# Patient Record
Sex: Female | Born: 1990 | Race: Black or African American | Hispanic: No | Marital: Single | Smoking: Never smoker
Health system: Southern US, Community
[De-identification: ages and names within clinical notes are randomized; demographics above are authoritative.]

## PROBLEM LIST (undated history)

## (undated) DIAGNOSIS — R569 Unspecified convulsions: Secondary | ICD-10-CM

---

## 2013-11-23 LAB — URINALYSIS, COMPLETE
BILIRUBIN, UR: NEGATIVE
Bacteria: NONE SEEN
GLUCOSE, UR: NEGATIVE mg/dL (ref 0–75)
Ketone: NEGATIVE
LEUKOCYTE ESTERASE: NEGATIVE
Nitrite: NEGATIVE
PH: 5 (ref 4.5–8.0)
Protein: NEGATIVE
RBC,UR: 11 /HPF (ref 0–5)
SPECIFIC GRAVITY: 1.018 (ref 1.003–1.030)
WBC UR: 3 /HPF (ref 0–5)

## 2013-11-23 LAB — COMPREHENSIVE METABOLIC PANEL
ALBUMIN: 3.8 g/dL (ref 3.4–5.0)
Alkaline Phosphatase: 80 U/L
Anion Gap: 7 (ref 7–16)
BUN: 11 mg/dL (ref 7–18)
Bilirubin,Total: 0.5 mg/dL (ref 0.2–1.0)
CO2: 25 mmol/L (ref 21–32)
Calcium, Total: 9.4 mg/dL (ref 8.5–10.1)
Chloride: 103 mmol/L (ref 98–107)
Creatinine: 0.7 mg/dL (ref 0.60–1.30)
EGFR (African American): >60
Glucose: 73 mg/dL (ref 65–99)
Osmolality: 268 (ref 275–301)
Potassium: 4 mmol/L (ref 3.5–5.1)
SGOT(AST): 25 U/L (ref 15–37)
SGPT (ALT): 17 U/L (ref 12–78)
Sodium: 135 mmol/L — ABNORMAL LOW (ref 136–145)
Total Protein: 8.1 g/dL (ref 6.4–8.2)

## 2013-11-23 LAB — CBC WITH DIFFERENTIAL/PLATELET
Basophil #: 0 10*3/uL (ref 0.0–0.1)
Basophil %: 0.5 %
EOS ABS: 0.3 10*3/uL (ref 0.0–0.7)
Eosinophil %: 2.9 %
HCT: 38.7 % (ref 35.0–47.0)
HGB: 13 g/dL (ref 12.0–16.0)
Lymphocyte #: 3.9 10*3/uL — ABNORMAL HIGH (ref 1.0–3.6)
Lymphocyte %: 39.3 %
MCH: 31.1 pg (ref 26.0–34.0)
MCHC: 33.5 g/dL (ref 32.0–36.0)
MCV: 93 fL (ref 80–100)
Monocyte #: 0.8 x10 3/mm (ref 0.2–0.9)
Monocyte %: 8.4 %
NEUTROS PCT: 48.9 %
Neutrophil #: 4.9 10*3/uL (ref 1.4–6.5)
PLATELETS: 278 10*3/uL (ref 150–440)
RBC: 4.17 10*6/uL (ref 3.80–5.20)
RDW: 11.9 % (ref 11.5–14.5)
WBC: 10 10*3/uL (ref 3.6–11.0)

## 2013-11-23 LAB — PREGNANCY, URINE: PREGNANCY TEST, URINE: NEGATIVE m[IU]/mL

## 2013-11-24 ENCOUNTER — Observation Stay: Payer: Self-pay | Admitting: Student

## 2013-11-24 LAB — DRUG SCREEN, URINE
Amphetamines, Ur Screen: NEGATIVE (ref ?–1000)
BARBITURATES, UR SCREEN: NEGATIVE (ref ?–200)
Benzodiazepine, Ur Scrn: NEGATIVE (ref ?–200)
CANNABINOID 50 NG, UR ~~LOC~~: NEGATIVE (ref ?–50)
Cocaine Metabolite,Ur ~~LOC~~: NEGATIVE (ref ?–300)
MDMA (ECSTASY) UR SCREEN: NEGATIVE (ref ?–500)
Methadone, Ur Screen: NEGATIVE (ref ?–300)
Opiate, Ur Screen: NEGATIVE (ref ?–300)
Phencyclidine (PCP) Ur S: NEGATIVE (ref ?–25)
Tricyclic, Ur Screen: NEGATIVE (ref ?–1000)

## 2013-12-04 ENCOUNTER — Emergency Department: Payer: Self-pay | Admitting: Emergency Medicine

## 2013-12-05 LAB — URINALYSIS, COMPLETE
Bilirubin,UR: NEGATIVE
Glucose,UR: NEGATIVE mg/dL (ref 0–75)
KETONE: NEGATIVE
Leukocyte Esterase: NEGATIVE
Nitrite: NEGATIVE
PH: 5 (ref 4.5–8.0)
Protein: NEGATIVE
SPECIFIC GRAVITY: 1.009 (ref 1.003–1.030)

## 2013-12-05 LAB — ETHANOL: Ethanol: <3 mg/dL

## 2013-12-05 LAB — COMPREHENSIVE METABOLIC PANEL
ALBUMIN: 4.3 g/dL (ref 3.4–5.0)
Alkaline Phosphatase: 89 U/L
Anion Gap: 14 (ref 7–16)
BUN: 9 mg/dL (ref 7–18)
Bilirubin,Total: 0.8 mg/dL (ref 0.2–1.0)
CHLORIDE: 101 mmol/L (ref 98–107)
CO2: 20 mmol/L — AB (ref 21–32)
CREATININE: 0.77 mg/dL (ref 0.60–1.30)
Calcium, Total: 9 mg/dL (ref 8.5–10.1)
EGFR (African American): >60
EGFR (Non-African Amer.): >60
Glucose: 85 mg/dL (ref 65–99)
OSMOLALITY: 268 (ref 275–301)
Potassium: 3.8 mmol/L (ref 3.5–5.1)
SGOT(AST): 18 U/L (ref 15–37)
SGPT (ALT): 22 U/L (ref 12–78)
Sodium: 135 mmol/L — ABNORMAL LOW (ref 136–145)
Total Protein: 8.8 g/dL — ABNORMAL HIGH (ref 6.4–8.2)

## 2013-12-05 LAB — DRUG SCREEN, URINE
Amphetamines, Ur Screen: NEGATIVE (ref ?–1000)
BARBITURATES, UR SCREEN: NEGATIVE (ref ?–200)
Benzodiazepine, Ur Scrn: NEGATIVE (ref ?–200)
COCAINE METABOLITE, UR ~~LOC~~: NEGATIVE (ref ?–300)
Cannabinoid 50 Ng, Ur ~~LOC~~: NEGATIVE (ref ?–50)
MDMA (ECSTASY) UR SCREEN: NEGATIVE (ref ?–500)
Methadone, Ur Screen: NEGATIVE (ref ?–300)
OPIATE, UR SCREEN: NEGATIVE (ref ?–300)
Phencyclidine (PCP) Ur S: NEGATIVE (ref ?–25)
TRICYCLIC, UR SCREEN: NEGATIVE (ref ?–1000)

## 2013-12-05 LAB — MAGNESIUM: Magnesium: 2 mg/dL

## 2013-12-05 LAB — TROPONIN I: Troponin-I: <0.02 ng/mL

## 2013-12-06 LAB — URINE CULTURE

## 2014-03-19 ENCOUNTER — Emergency Department: Payer: Self-pay | Admitting: Emergency Medicine

## 2014-09-25 NOTE — Discharge Summary (Signed)
PATIENT NAME:  Suzanne Garrett, Suzanne Garrett MR#:  [id number] DATE OF BIRTH:  Aug 10, 1990  DATE OF ADMISSION:  11/24/2013 DATE OF DISCHARGE:  11/25/2013  PRIMARY CARE PHYSICIAN: None.   CHIEF COMPLAINT: Seizure.   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    DISCHARGE MEDICATIONS: Keppra 750 mg every 12 hours, folate 3 mg daily.   DISCHARGE INSTRUCTIONS: Stop driving or operating heavy machinery for 6 months. [facility]. Avoid alcohol and sleep deprivation.   DISPOSITION: Home.   SIGNIFICANT LABS AND IMAGING: Urine pregnancy negative. Serum prolactin elevated at 38.5. UA did not suggest an infection. White count of 10, hemoglobin 38.7, platelets of 278 on admission. U-tox negative. LFTs negative. Serum sodium 135. CT head without contrast: Mild chronic ethmoid and left frontal sinusitis. MRI of brain without contrast showing unremarkable appearance of the brain allowing for artifact as above. The artifact is susceptibility artifact from orthodontic braces which partially obscures the anterior temporal and inferior frontal lobes. No evidence of acute infarct or intracranial hemorrhage.   HISTORY OF PRESENT ILLNESS AND HOSPITAL COURSE: For full details of the H and P, please see the dictation on 06/23 by Dr. Clint GuyHower, but briefly, this is a pleasant 24 year old female with no significant past medical history who came in with seizure activity. She had some headache which was in the occipital region of the head. She went to work as another member of the workforce had called in sick. She next realized that she was on the floor and had a seizure, including stumbling towards the wall and falling to the floor without any head trauma. There was loss of consciousness per her. There was no tongue biting. She was admitted to the hospitalist service and had a CT of the head which was negative but had an EEG showing sharp waves focally concerning for underlying lesion. She was sent for an MRI which was  negative. She was started on Keppra. She was discharged on Keppra 750 mg b.i.d., and we added folic acid 3 mg per day. She was counseled to avoid alcohol and sleep deprivation. At this point, she is seizure-free. She is to go home and follow with neurology as an outpatient. I do not believe that sodium of 135 had any role for the presentation of symptoms.   PHYSICAL EXAMINATION:  VITAL SIGNS: On the day of discharge, temperature is 98.3, pulse rate 83, respiratory rate 18, blood pressure 118/71, O2 sat 100%.  GENERAL: The patient is a well-developed female who is sitting in bed.  HEENT: Normocephalic, atraumatic.  HEART: Normal S1, S2.  LUNGS: Clear.  ABDOMEN: Benign.  EXTREMITIES: No pitting edema.  NEUROLOGIC: She is awake, alert, oriented x 3.   TOTAL TIME SPENT: About 33 minutes.   The patient is FULL CODE.    ____________________________ Krystal EatonShayiq Ahmadzia, MD sa:gb D: 11/25/2013 12:37:34 ET T: 11/26/2013 00:35:48 ET JOB#: [id number]  cc: Krystal EatonShayiq Ahmadzia, MD, <Dictator> [facility] Krystal EatonSHAYIQ AHMADZIA MD ELECTRONICALLY SIGNED 12/01/2013 18:19

## 2014-09-25 NOTE — H&P (Signed)
PATIENT NAME:  Suzanne Garrett, Suzanne Garrett MR#:  [id number] DATE OF BIRTH:  11-07-1990  DATE OF ADMISSION:  11/24/2013  REFERRING PHYSICIAN: Malinda.   PRIMARY CARE PHYSICIAN: None.   CHIEF COMPLAINT: Seizures.   HISTORY OF PRESENT ILLNESS: A 24 year old [place] American female without significant past medical history, presenting after seizure activity. The patient was at work earlier today complaining only of headache which she described "in the occipital region of my head." She was apparently called in to work as some other member called out sick. She was having trouble working the Ambulance person. Then the next thing she knows, she was apparently on the floor. States that her seizure activity included her stumbling towards the wall and falling to the floor; however, no head trauma. There was loss of consciousness per her. No tongue biting. No loss of bowel or bladder function. No postictal confusion. States that she awoke completely in the Emergency Department. Currently asymptomatic. The case was discussed with neurology by ED staff. Recommend EEG and observation. Currently no complaints.   REVIEW OF SYSTEMS:   CONSTITUTIONAL: Denies fever, chills, fatigue.  EYES: Denies blurred vision, double vision, eye pain.  EARS, NOSE, THROAT: Denies tinnitus, ear pain, hearing loss.  RESPIRATORY: Denies cough, wheeze, shortness of breath.  CARDIOVASCULAR: Denies chest pain, palpitations, edema.  GASTROINTESTINAL: Denies nausea, vomiting, diarrhea, abdominal pain.  GENITOURINARY: Denies dysuria or hematuria.  ENDOCRINE: Denies nocturia or thyroid problems.  HEMATOLOGIC AND LYMPHATIC: Denies easy bruising or bleeding.  SKIN: Denies rashes or lesions.  MUSCULOSKELETAL: Denies pain in neck, back, shoulder, knees, hips or arthritic symptoms.  NEUROLOGIC: Denies paralysis, paresthesias. Positive for seizure activity as described above.  PSYCHIATRIC: Denies anxiety or depressive symptoms.   Otherwise, full review of  systems performed by me is negative.   PAST MEDICAL HISTORY: None.   SOCIAL HISTORY: Positive for occasional alcohol use. Denies any tobacco use or drug use.   FAMILY HISTORY: Denies any known cardiovascular or pulmonary disorders or seizure activity.   ALLERGIES: No known drug allergies.   HOME MEDICATIONS: None.   PHYSICAL EXAMINATION:  VITAL SIGNS: Temperature 99.1, heart rate 93, respirations 24, blood pressure 145/92, saturating 99% on room air. Weight 86.2 kg, BMI 28.9.  GENERAL: Well-nourished, well-developed, African American female, currently in no acute distress.  HEAD: Normocephalic, atraumatic.  EYES: Pupils equal, round and reactive to light. Extraocular muscles intact. No scleral icterus.  MOUTH: Moist mucosal membranes. Dentition intact. No abscess noted.  EARS, NOSE, THROAT: Clear without exudates. No external lesions.  NECK: Supple. No thyromegaly. No nodules. No JVD.  PULMONARY: Clear to auscultation bilaterally without wheezes, rubs or rhonchi. No use of accessory muscles. Good respiratory effort.   CHEST: Nontender to palpation.  CARDIOVASCULAR: S1, S2, regular rate and rhythm. No murmurs, rubs or gallops. No edema. Pedal pulses 2+ bilaterally.  GASTROINTESTINAL: Soft, nontender, nondistended. No masses. Positive bowel sounds. No hepatosplenomegaly.  MUSCULOSKELETAL: No swelling, clubbing or edema. Range of motion full in all extremities.  NEUROLOGIC: Cranial nerves II through XII intact. No gross focal neurological deficits. Sensation intact. Reflexes intact. Pronator drift within normal limits. Strength 5 out of 5 in all extremities including proximal and distal flexor and extension muscle groups.  SKIN: No ulcerations, lesions, rashes or cyanosis.  PSYCHIATRIC: Mood and affect within normal limits. The patient is awake, alert, oriented x 3. Insight and judgment intact.   LABORATORY DATA: Chest x-ray performed. No acute cardiopulmonary process. CT head performed. No  acute intracranial process. Remainder of laboratory data: Sodium, potassium  4, chloride 103, bicarb 25, BUN 11, creatinine 0.7, glucose 73. LFTs within normal limits. Urine drug screen negative. WBC 10, hemoglobin 13, platelets of 278. Urinalysis negative for evidence of infection. Urine pregnancy test negative.   ASSESSMENT AND PLAN: A 24 year old [place] American female with history of seizures. 1. Seizure activity: The case was discussed with neurology in the Emergency Department. Will perform an EEG. Will also check a prolactin.  2. Hyponatremia: Provide intravenous fluid hydration with normal saline.  3. Venous thromboembolism prophylaxis with heparin subcutaneous.   The patient is FULL CODE.   TIME SPENT: 45 minutes.   ____________________________ Cletis Athensavid K. Hower, MD dkh:gb D: 11/24/2013 00:40:45 ET T: 11/24/2013 02:26:20 ET JOB#: [id number]  cc: Cletis Athensavid K. Hower, MD, <Dictator> DAVID Synetta ShadowK HOWER MD ELECTRONICALLY SIGNED 11/25/2013 3:40

## 2014-09-25 NOTE — Consult Note (Signed)
Referring Physician:  Lytle Butte   Primary Care Physician:  [facility], [address] [place], [place] [address], [place] [phone]  Reason for Consult: Admit Date: 23-Nov-2013  Chief Complaint: seizure   History of Present Illness: History of Present Illness:   24 yo RHD F presents to [facility] after new onset seizure.   Pt does not remember the episode but it was witnessed shaking.  There was no tongue biting or incontinence.  Pt denies headache or focal weakness/numbness or dizziness.  Pt never had anything like this before and was confused after.  ROS:  General denies complaints   HEENT no complaints   Lungs no complaints   Cardiac no complaints   GI no complaints   GU no complaints   Musculoskeletal no complaints   Extremities no complaints   Skin no complaints   Neuro no complaints   Endocrine no complaints   Psych no complaints   Past Medical/Surgical Hx:  denies medical history:   denies surgical history:   Past Medical/ Surgical Hx:  Past Medical History none   Past Surgical History none   Allergies:  No Known Allergies:   Social/Family History: Employment Status: currently employed  Lives With: parents  Living Arrangements: house  Social History: no tob, no EtOH, no illicits, there is a remote history of head trauma, no febrile seizures, no brain infections  Family History: no seizures, no strokenormal ww   Vital Signs: **Vital Signs.:   23-Jun-15 09:27  Vital Signs Type Q 4hr  Temperature Temperature (F) 98  Celsius 36.6  Temperature Source oral  Pulse Pulse 80  Respirations Respirations 18  Systolic BP Systolic BP 032  Diastolic BP (mmHg) Diastolic BP (mmHg) 82  Mean BP 97  Pulse Ox % Pulse Ox % 98  Pulse Ox Activity Level  At rest  Oxygen Delivery Room Air/ 21 %   Physical Exam: General: normal weight, NAD  HEENT: normocephalic, sclera nonicteric, oropharynx clear  Neck: supple, no JVD, no  bruits  Chest: CTA B, no wheezing, good movement  Cardiac: RRR, no murmurs, no edema, 2+ pulses  Extremities: no C/C/E, FROM   Neurologic Exam: Mental Status: alert and oriented x 3, normal speech and language, follows complex commands  Cranial Nerves: PERRLA, EOMI, nl VF, face symmetric, tongue midline, shoulder shrug equal  Motor Exam: 5/5 B normal, tone, no tremor  Deep Tendon Reflexes: 2+/4 B, plantars downgoing B, no Hoffman  Sensory Exam: pinprick, temperature, and vibration intact B  Coordination: FTN and HTS WNL   Lab Results:  Hepatic:  22-Jun-15 22:18   Bilirubin, Total 0.5  Alkaline Phosphatase 80 (45-117 NOTE: New Reference Range 04/24/13)  SGPT (ALT) 17  SGOT (AST) 25  Total Protein, Serum 8.1  Albumin, Serum 3.8  Routine Chem:  22-Jun-15 22:18   Glucose, Serum 73  BUN 11  Creatinine (comp) 0.70  Sodium, Serum  135  Potassium, Serum 4.0  Chloride, Serum 103  CO2, Serum 25  Calcium (Total), Serum 9.4  Osmolality (calc) 268  eGFR (African American) >60  eGFR (Non-African American) >60 (eGFR values <88mL/min/1.73 m2 may be an indication of chronic kidney disease (CKD). Calculated eGFR is useful in patients with stable renal function. The eGFR calculation will not be reliable in acutely ill patients when serum creatinine is changing rapidly. It is not useful in  patients on dialysis. The eGFR calculation may not be applicable to patients at the low and high extremes of  body sizes, pregnant women, and vegetarians.)  Anion Gap 7  Urine Drugs:  11-HER-74 08:14   Tricyclic Antidepressant, Ur Qual (comp) NEGATIVE (Result(s) reported on 24 Nov 2013 at 12:09AM.)  Amphetamines, Urine Qual. NEGATIVE  MDMA, Urine Qual. NEGATIVE  Cocaine Metabolite, Urine Qual. NEGATIVE  Opiate, Urine qual NEGATIVE  Phencyclidine, Urine Qual. NEGATIVE  Cannabinoid, Urine Qual. NEGATIVE  Barbiturates, Urine Qual. NEGATIVE  Benzodiazepine, Urine Qual. NEGATIVE  (----------------- The URINE DRUG SCREEN provides only a preliminary, unconfirmed analytical test result and should not be used for non-medical  purposes.  Clinical consideration and professional judgment should be  applied to any positive drug screen result due to possible interfering substances.  A more specific alternate chemical method must be used in order to obtain a confirmed analytical result.  Gas chromatography/mass spectrometry (GC/MS) is the preferred confirmatory method.)  Methadone, Urine Qual. NEGATIVE  Routine UA:  22-Jun-15 22:18   Color (UA) Yellow  Clarity (UA) Clear  Glucose (UA) Negative  Bilirubin (UA) Negative  Ketones (UA) Negative  Specific Gravity (UA) 1.018  Blood (UA) 2+  pH (UA) 5.0  Protein (UA) Negative  Nitrite (UA) Negative  Leukocyte Esterase (UA) Negative (Result(s) reported on 23 Nov 2013 at 11:15PM.)  RBC (UA) 11 /HPF  WBC (UA) 3 /HPF  Bacteria (UA) NONE SEEN  Epithelial Cells (UA) 2 /HPF  Mucous (UA) PRESENT (Result(s) reported on 23 Nov 2013 at 11:15PM.)  Routine Sero:  22-Jun-15 22:18   Pregnancy Test, Urine NEGATIVE (The results of the qualitative urine HCG (Pregnancy Test) should be evaluated in light of other clinical information.  There are limitations to the test which, in certain clinical situations, may result in a false positive or negative result. Thehigh dose hook effect can occur in urine samples with extremely high HCG concentrations.  This effect can produce a negative result in certain situations. It is suggested that results of the qualitative HCG be confirmed by an alternate methodology, such as the quantitative serum beta HCG test.)  Routine Hem:  22-Jun-15 22:18   WBC (CBC) 10.0  RBC (CBC) 4.17  Hemoglobin (CBC) 13.0  Hematocrit (CBC) 38.7  Platelet Count (CBC) 278  MCV 93  MCH 31.1  MCHC 33.5  RDW 11.9  Neutrophil % 48.9  Lymphocyte % 39.3  Monocyte % 8.4  Eosinophil % 2.9  Basophil % 0.5  Neutrophil #  4.9  Lymphocyte #  3.9  Monocyte # 0.8  Eosinophil # 0.3  Basophil # 0.0 (Result(s) reported on 23 Nov 2013 at 10:59PM.)   Radiology Results: CT:    22-Jun-15 23:28, CT Head Without Contrast  CT Head Without Contrast   REASON FOR EXAM:    seizure vs pseudoseizure  COMMENTS:       PROCEDURE: CT  - CT HEAD WITHOUT CONTRAST  - Nov 23 2013 11:28PM     CLINICAL DATA:  Seizure.    EXAM:  CT HEAD WITHOUT CONTRAST    TECHNIQUE:  Contiguous axial images were obtained from the base of the skull  through the vertex without intravenous contrast.    COMPARISON:  None.  FINDINGS:  The brainstem, cerebellum, cerebral peduncles, thalamus, basal  ganglia, basilar cisterns, and ventricular system appear within  normal limits. Nointracranial hemorrhage, mass lesion, or acute  CVA. Mild chronic ethmoid and left frontal sinusitis.    No additional significant abnormalities.     IMPRESSION:  1. Mild chronic ethmoid and left frontal sinusitis.  2. No significant intracranial abnormality identified.  Electronically Signed    By: Sherryl Barters M.D.    On: 11/24/2013 00:10         Verified By: Carron Curie, M.D.,   Radiology Impression: Radiology Impression: MRI of brain personally reviewed by me and was normal   Impression/Recommendations: Recommendations:   prior notes reviewed by me reviewed by me   Probable epilepsy-  abnormal EEG with focal sharps is concerning for underlying lesion even though MRI was normal start Keppra 794m BID pt must avoid EtOH and sleep deprivation no driving or operating heavy machinery x 6 months can d/c in AM, [facility]  Electronic Signatures: SJamison Neighbor(MD)  (Signed 23-Jun-15 21:43)  Authored: REFERRING PHYSICIAN, Primary Care Physician, Consult, History of Present Illness, Review of Systems, PAST MEDICAL/SURGICAL HISTORY, HOME MEDICATIONS, ALLERGIES, Social/Family History, NURSING VITAL SIGNS,  Physical Exam-, LAB RESULTS, RADIOLOGY RESULTS, Recommendations   Last Updated: 23-Jun-15 21:43 by SJamison Neighbor(MD)

## 2015-04-03 ENCOUNTER — Emergency Department
Admission: EM | Admit: 2015-04-03 | Discharge: 2015-04-03 | Disposition: A | Discharge status: Home / Self Care | Payer: Medicaid Other | Attending: Emergency Medicine | Admitting: Emergency Medicine

## 2015-04-03 ENCOUNTER — Encounter: Payer: Self-pay | Admitting: Emergency Medicine

## 2015-04-03 DIAGNOSIS — E860 Dehydration: Secondary | ICD-10-CM | POA: Insufficient documentation

## 2015-04-03 DIAGNOSIS — R110 Nausea: Secondary | ICD-10-CM | POA: Diagnosis present

## 2015-04-03 DIAGNOSIS — R630 Anorexia: Secondary | ICD-10-CM | POA: Diagnosis not present

## 2015-04-03 DIAGNOSIS — Z3202 Encounter for pregnancy test, result negative: Secondary | ICD-10-CM | POA: Diagnosis not present

## 2015-04-03 DIAGNOSIS — N390 Urinary tract infection, site not specified: Secondary | ICD-10-CM | POA: Insufficient documentation

## 2015-04-03 HISTORY — DX: Unspecified convulsions: R56.9

## 2015-04-03 LAB — URINALYSIS COMPLETE WITH MICROSCOPIC (ARMC ONLY)
BILIRUBIN URINE: NEGATIVE
GLUCOSE, UA: NEGATIVE mg/dL
HGB URINE DIPSTICK: NEGATIVE
NITRITE: NEGATIVE
PH: 5 (ref 5.0–8.0)
Protein, ur: NEGATIVE mg/dL
SPECIFIC GRAVITY, URINE: 1.019 (ref 1.005–1.030)

## 2015-04-03 LAB — COMPREHENSIVE METABOLIC PANEL
ALT: 13 U/L — ABNORMAL LOW (ref 14–54)
AST: 18 U/L (ref 15–41)
Albumin: 4.5 g/dL (ref 3.5–5.0)
Alkaline Phosphatase: 68 U/L (ref 38–126)
Anion gap: 9 (ref 5–15)
BILIRUBIN TOTAL: 2 mg/dL — AB (ref 0.3–1.2)
BUN: 8 mg/dL (ref 6–20)
CHLORIDE: 100 mmol/L — AB (ref 101–111)
CO2: 25 mmol/L (ref 22–32)
CREATININE: 0.62 mg/dL (ref 0.44–1.00)
Calcium: 9.2 mg/dL (ref 8.9–10.3)
Glucose, Bld: 106 mg/dL — ABNORMAL HIGH (ref 65–99)
POTASSIUM: 3.7 mmol/L (ref 3.5–5.1)
Sodium: 134 mmol/L — ABNORMAL LOW (ref 135–145)
Total Protein: 8.7 g/dL — ABNORMAL HIGH (ref 6.5–8.1)

## 2015-04-03 LAB — CBC
HEMATOCRIT: 35.8 % (ref 35.0–47.0)
Hemoglobin: 12.6 g/dL (ref 12.0–16.0)
MCH: 31.8 pg (ref 26.0–34.0)
MCHC: 35.2 g/dL (ref 32.0–36.0)
MCV: 90.4 fL (ref 80.0–100.0)
PLATELETS: 301 10*3/uL (ref 150–440)
RBC: 3.97 MIL/uL (ref 3.80–5.20)
RDW: 11.5 % (ref 11.5–14.5)
WBC: 13.7 10*3/uL — AB (ref 3.6–11.0)

## 2015-04-03 LAB — LIPASE, BLOOD: LIPASE: 17 U/L (ref 11–51)

## 2015-04-03 LAB — POCT PREGNANCY, URINE: Preg Test, Ur: NEGATIVE

## 2015-04-03 MED ORDER — CIPROFLOXACIN HCL 500 MG PO TABS: 500.0000 mg | ORAL_TABLET | Freq: Two times a day (BID) | ORAL | Status: AC

## 2015-04-03 MED ORDER — SODIUM CHLORIDE 0.9 % IV SOLN
1000.0000 mL | Freq: Once | INTRAVENOUS | Status: AC
  Administered 2015-04-03: 1000 mL via INTRAVENOUS

## 2015-04-03 MED ORDER — ONDANSETRON 4 MG PO TBDP: 4.0000 mg | ORAL_TABLET | Freq: Once | ORAL | Status: DC | PRN

## 2015-04-03 NOTE — ED Notes (Signed)
Per pt she has not been able to keep any solids down for five days. Pt can keep water but no other liquids. Pt denies vomiting today and states no intake today. Pt is ambulatory, A/O, with NAD.

## 2015-04-03 NOTE — Discharge Instructions (Signed)
Dehydration, Adult °Dehydration is a condition in which you do not have enough fluid or water in your body. It happens when you take in less fluid than you lose. Vital organs such as the kidneys, brain, and heart cannot function without a proper amount of fluids. Any loss of fluids from the body can cause dehydration.  °Dehydration can range from mild to severe. This condition should be treated right away to help prevent it from becoming severe. °CAUSES  °This condition may be caused by: °· Vomiting. °· Diarrhea. °· Excessive sweating, such as when exercising in hot or humid weather. °· Not drinking enough fluid during strenuous exercise or during an illness. °· Excessive urine output. °· Fever. °· Certain medicines. °RISK FACTORS °This condition is more likely to develop in: °· People who are taking certain medicines that cause the body to lose excess fluid (diuretics).   °· People who have a chronic illness, such as diabetes, that may increase urination. °· Older adults.   °· People who live at high altitudes.   °· People who participate in endurance sports.   °SYMPTOMS  °Mild Dehydration °· Thirst. °· Dry lips. °· Slightly dry mouth. °· Dry, warm skin. °Moderate Dehydration °· Very dry mouth.   °· Muscle cramps.   °· Dark urine and decreased urine production.   °· Decreased tear production.   °· Headache.   °· Light-headedness, especially when you stand up from a sitting position.   °Severe Dehydration °· Changes in skin.   °¨ Cold and clammy skin.   °¨ Skin does not spring back quickly when lightly pinched and released.   °· Changes in body fluids.   °¨ Extreme thirst.   °¨ No tears.   °¨ Not able to sweat when body temperature is high, such as in hot weather.   °¨ Minimal urine production.   °· Changes in vital signs.   °¨ Rapid, weak pulse (more than 100 beats per minute when you are sitting still).   °¨ Rapid breathing.   °¨ Low blood pressure.   °· Other changes.   °¨ Sunken eyes.   °¨ Cold hands and feet.    °¨ Confusion. °¨ Lethargy and difficulty being awakened. °¨ Fainting (syncope).   °¨ Short-term weight loss.   °¨ Unconsciousness. °DIAGNOSIS  °This condition may be diagnosed based on your symptoms. You may also have tests to determine how severe your dehydration is. These tests may include:  °· Urine tests.   °· Blood tests.   °TREATMENT  °Treatment for this condition depends on the severity. Mild or moderate dehydration can often be treated at home. Treatment should be started right away. Do not wait until dehydration becomes severe. Severe dehydration needs to be treated at the hospital. °Treatment for Mild Dehydration °· Drinking plenty of water to replace the fluid you have lost.   °· Replacing minerals in your blood (electrolytes) that you may have lost.   °Treatment for Moderate Dehydration  °· Consuming oral rehydration solution (ORS). °Treatment for Severe Dehydration °· Receiving fluid through an IV tube.   °· Receiving electrolyte solution through a feeding tube that is passed through your nose and into your stomach (nasogastric tube or NG tube). °· Correcting any abnormalities in electrolytes. °HOME CARE INSTRUCTIONS  °· Drink enough fluid to keep your urine clear or pale yellow.   °· Drink water or fluid slowly by taking small sips. You can also try sucking on ice cubes.  °· Have food or beverages that contain electrolytes. Examples include bananas and sports drinks. °· Take over-the-counter and prescription medicines only as told by your health care provider.   °· Prepare ORS according to the manufacturer's instructions. Take sips   of ORS every 5 minutes until your urine returns to normal. °· If you have vomiting or diarrhea, continue to try to drink water, ORS, or both.   °· If you have diarrhea, avoid:   °¨ Beverages that contain caffeine.   °¨ Fruit juice.   °¨ Milk.    °¨ Carbonated soft drinks. °· Do not take salt tablets. This can lead to the condition of having too much sodium in your body  (hypernatremia).   °SEEK MEDICAL CARE IF: °· You cannot eat or drink without vomiting. °· You have had moderate diarrhea during a period of more than 24 hours. °· You have a fever. °SEEK IMMEDIATE MEDICAL CARE IF:  °· You have extreme thirst. °· You have severe diarrhea. °· You have not urinated in 6-8 hours, or you have urinated only a small amount of very dark urine. °· You have shriveled skin. °· You are dizzy, confused, or both. °  °This information is not intended to replace advice given to you by your health care provider. Make sure you discuss any questions you have with your health care provider. °  °Document Released: 05/21/2005 Document Revised: 02/09/2015 Document Reviewed: 10/06/2014 °Elsevier Interactive Patient Education ©2016 [employer] ° °Urinary Tract Infection °Urinary tract infections (UTIs) can develop anywhere along your urinary tract. Your urinary tract is your body's drainage system for removing wastes and extra water. Your urinary tract includes two kidneys, two ureters, a bladder, and a urethra. Your kidneys are a pair of bean-shaped organs. Each kidney is about the size of your fist. They are located below your ribs, one on each side of your spine. °CAUSES °Infections are caused by microbes, which are microscopic organisms, including fungi, viruses, and bacteria. These organisms are so small that they can only be seen through a microscope. Bacteria are the microbes that most commonly cause UTIs. °SYMPTOMS  °Symptoms of UTIs may vary by age and gender of the patient and by the location of the infection. Symptoms in young women typically include a frequent and intense urge to urinate and a painful, burning feeling in the bladder or urethra during urination. Older women and men are more likely to be tired, shaky, and weak and have muscle aches and abdominal pain. A fever may mean the infection is in your kidneys. Other symptoms of a kidney infection include pain in your back or sides below  the ribs, nausea, and vomiting. °DIAGNOSIS °To diagnose a UTI, your caregiver will ask you about your symptoms. Your caregiver will also ask you to provide a urine sample. The urine sample will be tested for bacteria and white blood cells. White blood cells are made by your body to help fight infection. °TREATMENT  °Typically, UTIs can be treated with medication. Because most UTIs are caused by a bacterial infection, they usually can be treated with the use of antibiotics. The choice of antibiotic and length of treatment depend on your symptoms and the type of bacteria causing your infection. °HOME CARE INSTRUCTIONS °· If you were prescribed antibiotics, take them exactly as your caregiver instructs you. Finish the medication even if you feel better after you have only taken some of the medication. °· Drink enough water and fluids to keep your urine clear or pale yellow. °· Avoid caffeine, tea, and carbonated beverages. They tend to irritate your bladder. °· Empty your bladder often. Avoid holding urine for long periods of time. °· Empty your bladder before and after sexual intercourse. °· After a bowel movement, women should cleanse from front to back.   Use each tissue only once. °SEEK MEDICAL CARE IF:  °· You have back pain. °· You develop a fever. °· Your symptoms do not begin to resolve within 3 days. °SEEK IMMEDIATE MEDICAL CARE IF:  °· You have severe back pain or lower abdominal pain. °· You develop chills. °· You have nausea or vomiting. °· You have continued burning or discomfort with urination. °MAKE SURE YOU:  °· Understand these instructions. °· Will watch your condition. °· Will get help right away if you are not doing well or get worse. °  °This information is not intended to replace advice given to you by your health care provider. Make sure you discuss any questions you have with your health care provider. °  °Document Released: 02/28/2005 Document Revised: 02/09/2015 Document Reviewed:  06/29/2011 °Elsevier Interactive Patient Education ©2016 [employer] ° °

## 2015-04-03 NOTE — ED Provider Notes (Signed)
[facility] Emergency Department Provider Note  ____________________________________________  Time seen: On arrival  I have reviewed the triage vital signs and the nursing notes.   HISTORY  Chief Complaint Decreased appetite   HPI Suzanne Garrett is a 24 y.o. female who reports she has had decreased appetite for the last 5 days. She says that foods that she typically likes seeing "gross" to her. She has had some mild nausea. No vomiting. She does report drinking water without difficulty. No fevers no chills. No abdominal pain. She reports normal period last week. No dysuria cough or shortness of breath     Past Medical History  Diagnosis Date  . Seizures (HCC)     There are no active problems to display for this patient.   History reviewed. No pertinent past surgical history.  No current outpatient prescriptions on file.  Allergies Review of patient's allergies indicates not on file.  No family history on file.  Social History Social History  Substance Use Topics  . Smoking status: Never Smoker   . Smokeless tobacco: None  . Alcohol Use: 1.2 - 1.8 oz/week    2-3 Glasses of wine per week    Review of Systems  Constitutional: Negative for fever. Eyes: Negative for visual changes. ENT: Negative for sore throat Cardiovascular: Negative for chest pain. Respiratory: Negative for shortness of breath. Gastrointestinal: Negative for abdominal pain, vomiting and diarrhea. Decreased appetite Genitourinary: Negative for dysuria. Musculoskeletal: Negative for back pain. Skin: Negative for rash. Neurological: Negative for headaches or focal weakness Psychiatric no anxiety    ____________________________________________   PHYSICAL EXAM:  VITAL SIGNS: ED Triage Vitals  Enc Vitals Group     BP 04/03/15 1951 136/75 mmHg     Pulse Rate 04/03/15 1951 99     Resp 04/03/15 1951 14     Temp 04/03/15 1951 98.7 F (37.1 C)     Temp Source  04/03/15 1951 Oral     SpO2 04/03/15 1951 94 %     Weight 04/03/15 1951 170 lb (77.111 kg)     Height 04/03/15 1951  (1.651 m)     Head Cir --      Peak Flow --      Pain Score 04/03/15 1953 8     Pain Loc --      Pain Edu? --      Excl. in GC? --      Constitutional: Alert and oriented. Well appearing and in no distress. Eyes: Conjunctivae are normal.  ENT   Head: Normocephalic and atraumatic.   Mouth/Throat: Mucous membranes are moist. Cardiovascular: Normal rate, regular rhythm. Normal and symmetric distal pulses are present in all extremities. No murmurs, rubs, or gallops. Respiratory: Normal respiratory effort without tachypnea nor retractions. Breath sounds are clear and equal bilaterally.  Gastrointestinal: Soft and non-tender in all quadrants. No distention. There is no CVA tenderness. Genitourinary: deferred Musculoskeletal: Nontender with normal range of motion in all extremities. No lower extremity tenderness nor edema. Neurologic:  Normal speech and language. No gross focal neurologic deficits are appreciated. Skin:  Skin is warm, dry and intact. No rash noted. Psychiatric: Mood and affect are normal. Patient exhibits appropriate insight and judgment.  ____________________________________________    LABS (pertinent positives/negatives)  Labs Reviewed  CBC - Abnormal; Notable for the following:    WBC 13.7 (*)    All other components within normal limits  URINALYSIS COMPLETEWITH MICROSCOPIC ([facility] ONLY) - Abnormal; Notable for the following:    Color,  Urine YELLOW (*)    APPearance CLEAR (*)    Ketones, ur 1+ (*)    Leukocytes, UA 3+ (*)    Bacteria, UA RARE (*)    Squamous Epithelial / LPF 0-5 (*)    All other components within normal limits  LIPASE, BLOOD  COMPREHENSIVE METABOLIC PANEL  POC URINE PREG, ED  POCT PREGNANCY, URINE    ____________________________________________   EKG  None  ____________________________________________     RADIOLOGY I have personally reviewed any xrays that were ordered on this patient: None  ____________________________________________   PROCEDURES  Procedure(s) performed: none  Critical Care performed: none  ____________________________________________   INITIAL IMPRESSION / ASSESSMENT AND PLAN / ED COURSE  Pertinent labs & imaging results that were available during my care of the patient were reviewed by me and considered in my medical decision making (see chart for details).  Patient overall well-appearing and in no distress. Her exam is benign. Unclear cause of her symptoms we will check labs, urine and reevaluate. We will give IV fluids  She does appear to have a mild urinary tract infection which we will treat  I discussed the mildly elevated bilirubin with patient and she will have a repeat check by her PCP ____________________________________________   FINAL CLINICAL IMPRESSION(S) / ED DIAGNOSES  Final diagnoses:  Nausea  Dehydration  UTI (lower urinary tract infection)     Jene Everyobert Darris Staiger, MD 04/03/15 2234

## 2015-05-08 IMAGING — CT CT HEAD WITHOUT CONTRAST
1 series · 16 of 30 positions shown, 20 images · non-contrast
Comparison: None.

CLINICAL DATA: Seizure.

EXAM:
CT HEAD WITHOUT CONTRAST
TECHNIQUE: Contiguous axial images were obtained from the base of the skull
through the vertex without intravenous contrast.

[Series 2: head wo · axial · 0.40mm/px · z∈[-68,+58]mm · 16 of 32 slices shown, 20 images]
[im 2/32  brain]
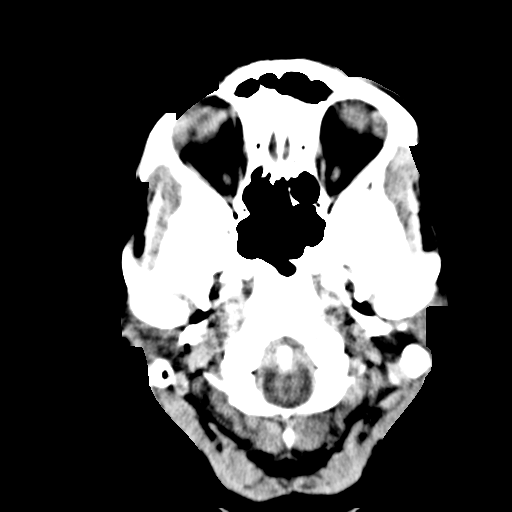
[im 2/32  bone]
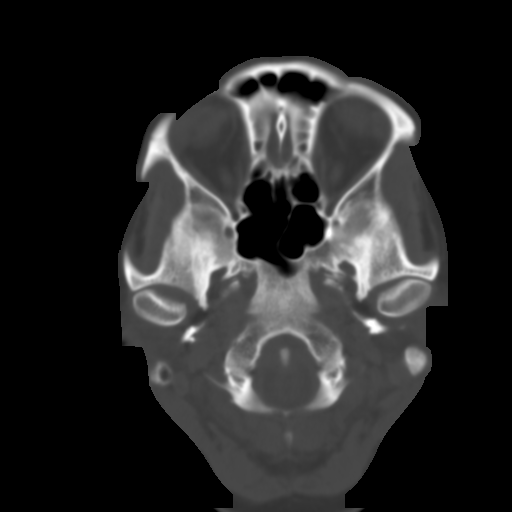
[im 4/32  brain]
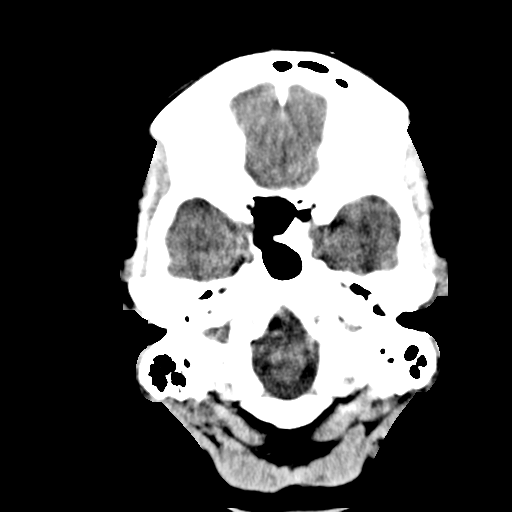
[im 6/32  brain]
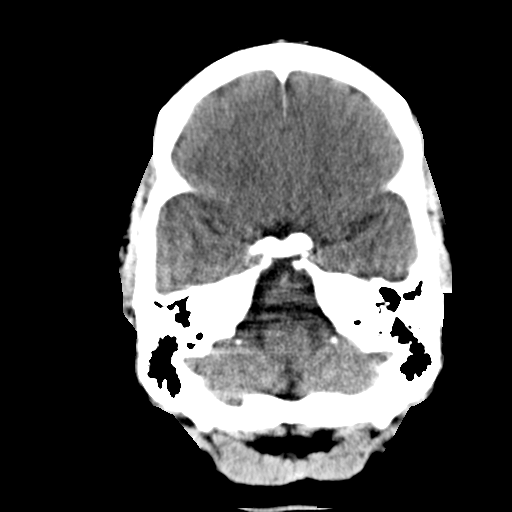
[im 8/32  brain]
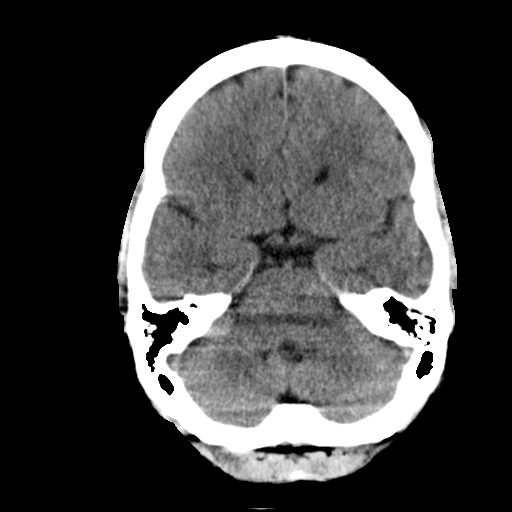
[im 9/32  brain]
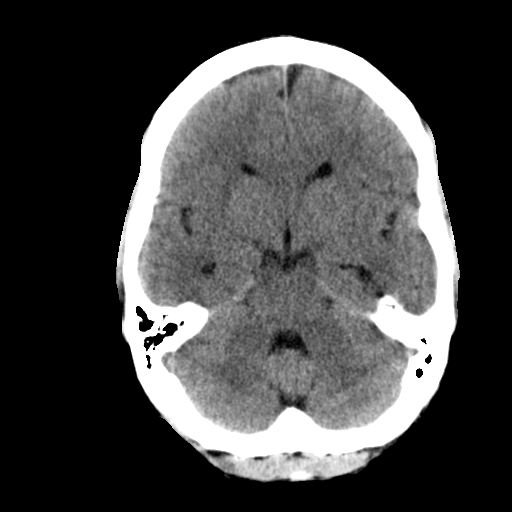
[im 9/32  bone]
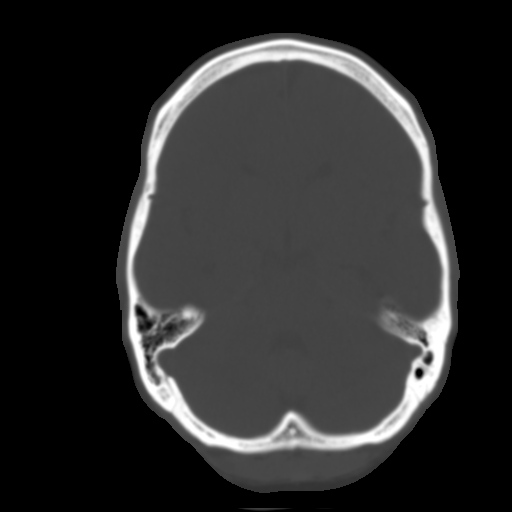
[im 11/32  brain]
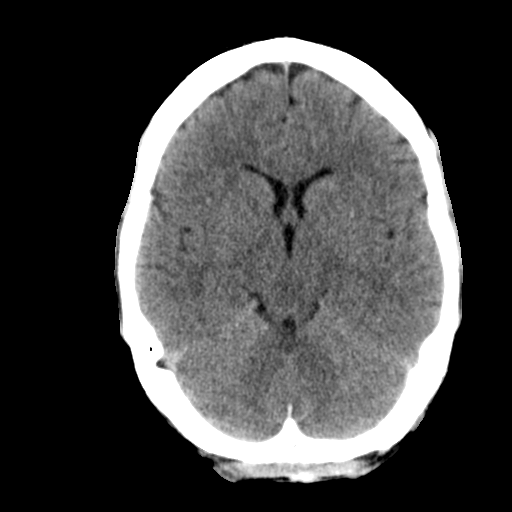
[im 13/32  brain]
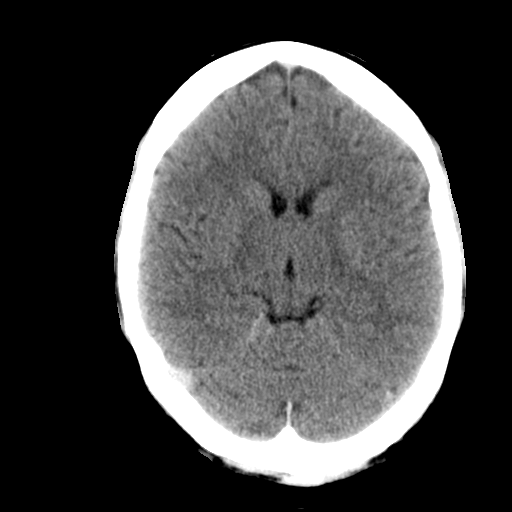
[im 15/32  brain]
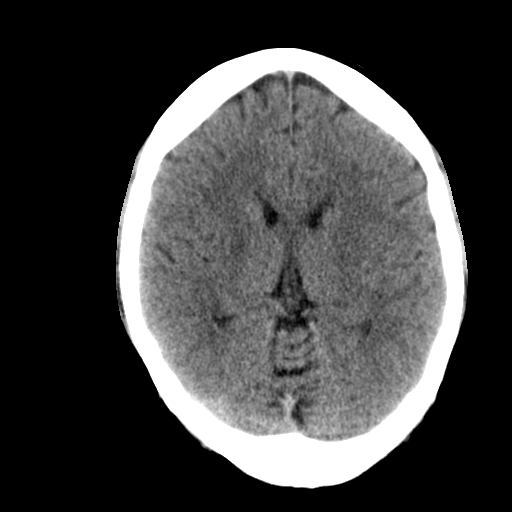
[im 17/32  brain]
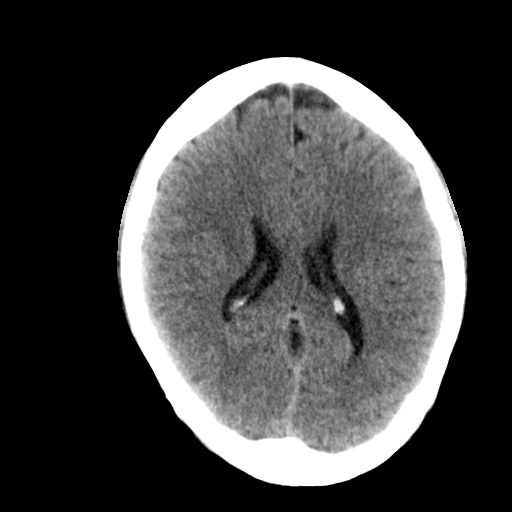
[im 17/32  bone]
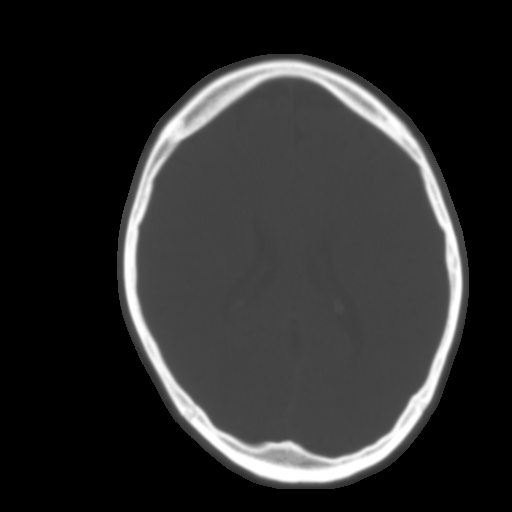
[im 19/32  brain]
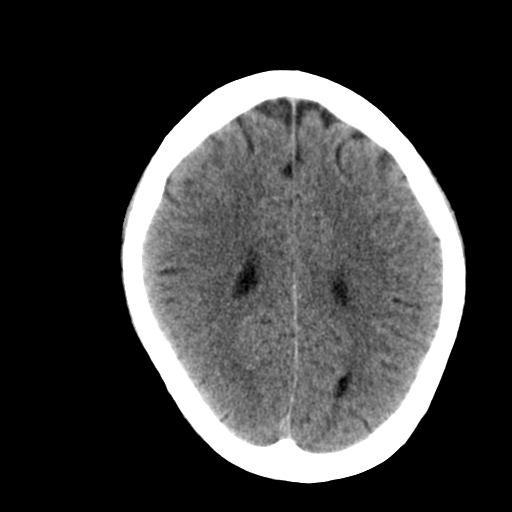
[im 21/32  brain]
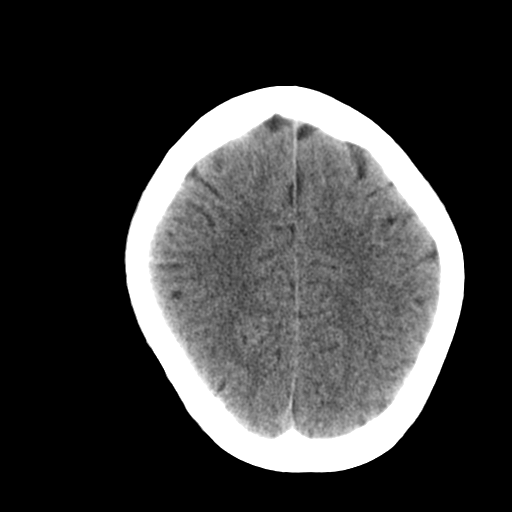
[im 23/32  brain]
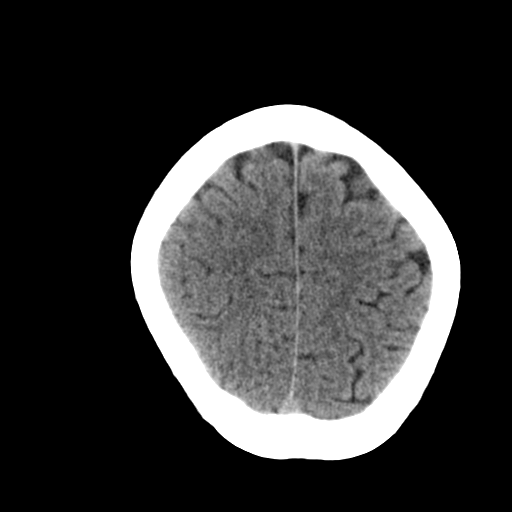
[im 24/32  brain]
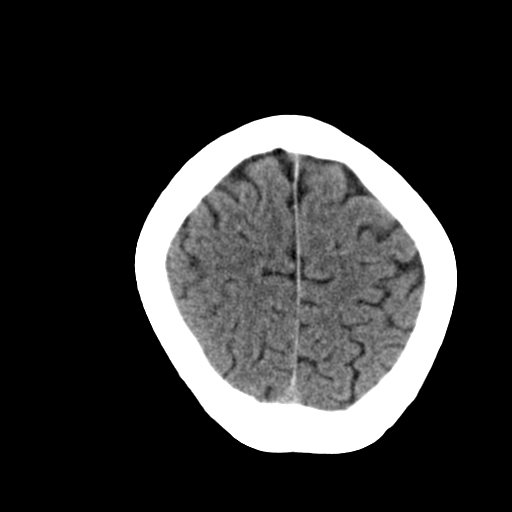
[im 24/32  bone]
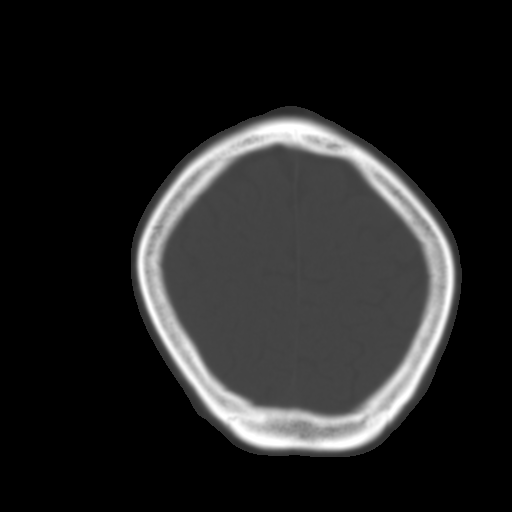
[im 26/32  brain]
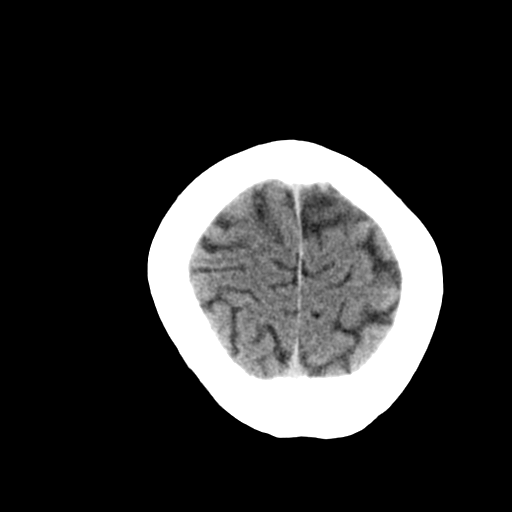
[im 28/32  brain]
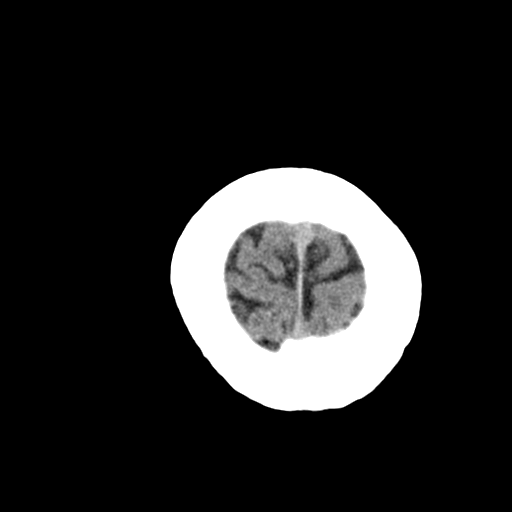
[im 30/32  brain]
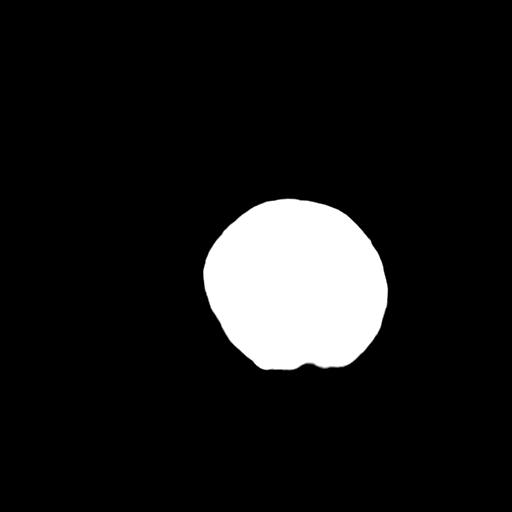

[16 of 30 positions shown; findings below may reference images not displayed]

FINDINGS: The brainstem, cerebellum, cerebral peduncles, thalamus, basal
ganglia, basilar cisterns, and ventricular system appear within
normal limits. No intracranial hemorrhage, mass lesion, or acute
CVA. Mild chronic ethmoid and left frontal sinusitis.

No additional significant abnormalities.
IMPRESSION: 1. Mild chronic ethmoid and left frontal sinusitis.
2. No significant intracranial abnormality identified.
